# Patient Record
Sex: Male | Born: 1970 | Hispanic: No | Marital: Single | Smoking: Never smoker
Health system: Southern US, Community
[De-identification: ages and names within clinical notes are randomized; demographics above are authoritative.]

## PROBLEM LIST (undated history)

## (undated) DIAGNOSIS — F419 Anxiety disorder, unspecified: Secondary | ICD-10-CM

## (undated) HISTORY — DX: Anxiety disorder, unspecified: F41.9

---

## 2002-02-26 ENCOUNTER — Emergency Department (HOSPITAL_COMMUNITY): Admission: EM | Admit: 2002-02-26 | Discharge: 2002-02-26 | Payer: Self-pay | Admitting: Emergency Medicine

## 2014-11-28 ENCOUNTER — Encounter (HOSPITAL_COMMUNITY): Payer: Self-pay | Admitting: Emergency Medicine

## 2014-11-28 ENCOUNTER — Emergency Department (HOSPITAL_COMMUNITY)
Admission: EM | Admit: 2014-11-28 | Discharge: 2014-11-29 | Disposition: A | Discharge status: Home / Self Care | Payer: Self-pay | Attending: Emergency Medicine | Admitting: Emergency Medicine

## 2014-11-28 DIAGNOSIS — R739 Hyperglycemia, unspecified: Secondary | ICD-10-CM | POA: Insufficient documentation

## 2014-11-28 DIAGNOSIS — R630 Anorexia: Secondary | ICD-10-CM | POA: Insufficient documentation

## 2014-11-28 DIAGNOSIS — Z79899 Other long term (current) drug therapy: Secondary | ICD-10-CM | POA: Insufficient documentation

## 2014-11-28 DIAGNOSIS — R0982 Postnasal drip: Secondary | ICD-10-CM | POA: Insufficient documentation

## 2014-11-28 DIAGNOSIS — F419 Anxiety disorder, unspecified: Secondary | ICD-10-CM | POA: Insufficient documentation

## 2014-11-28 LAB — CBC WITH DIFFERENTIAL/PLATELET
Basophils Absolute: 0 10*3/uL (ref 0.0–0.1)
Basophils Relative: 0 % (ref 0–1)
Eosinophils Absolute: 0 10*3/uL (ref 0.0–0.7)
Eosinophils Relative: 0 % (ref 0–5)
HCT: 37.7 % (ref 36.0–46.0)
HEMOGLOBIN: 12.9 g/dL (ref 12.0–15.0)
LYMPHS ABS: 2 10*3/uL (ref 0.7–4.0)
LYMPHS PCT: 26 % (ref 12–46)
MCH: 30.1 pg (ref 26.0–34.0)
MCHC: 34.2 g/dL (ref 30.0–36.0)
MCV: 88.1 fL (ref 78.0–100.0)
MONOS PCT: 4 % (ref 3–12)
Monocytes Absolute: 0.3 10*3/uL (ref 0.1–1.0)
NEUTROS ABS: 5.3 10*3/uL (ref 1.7–7.7)
NEUTROS PCT: 70 % (ref 43–77)
Platelets: 346 10*3/uL (ref 150–400)
RBC: 4.28 MIL/uL (ref 3.87–5.11)
RDW: 12.4 % (ref 11.5–15.5)
WBC: 7.7 10*3/uL (ref 4.0–10.5)
nRBC: 0 /100 WBC

## 2014-11-28 LAB — COMPREHENSIVE METABOLIC PANEL
ALK PHOS: 73 U/L (ref 39–117)
ALT: 13 U/L (ref 0–35)
AST: 14 U/L (ref 0–37)
Albumin: 4.1 g/dL (ref 3.5–5.2)
Anion gap: 12 (ref 5–15)
BUN: 11 mg/dL (ref 6–23)
CO2: 26 meq/L (ref 19–32)
Calcium: 9.1 mg/dL (ref 8.4–10.5)
Chloride: 97 mEq/L (ref 96–112)
Creatinine, Ser: 0.82 mg/dL (ref 0.50–1.10)
GFR, EST NON AFRICAN AMERICAN: 86 mL/min — AB (ref >90)
Glucose, Bld: 140 mg/dL — ABNORMAL HIGH (ref 70–99)
POTASSIUM: 3.5 meq/L — AB (ref 3.7–5.3)
SODIUM: 135 meq/L — AB (ref 137–147)
Total Bilirubin: 0.4 mg/dL (ref 0.3–1.2)
Total Protein: 7.7 g/dL (ref 6.0–8.3)

## 2014-11-28 NOTE — ED Notes (Signed)
Pt. reports intermittent anxiety/panic attack onset last week with poor appetite /insomnia and fatigue . Denies hallucinations , no suicidal ideations .

## 2014-11-29 MED ORDER — LORAZEPAM 1 MG PO TABS
1.0000 mg | ORAL_TABLET | Freq: Once | ORAL | Status: AC
  Administered 2014-11-29: 1 mg via ORAL
  Filled 2014-11-29: qty 1

## 2014-11-29 MED ORDER — LORAZEPAM 1 MG PO TABS: 1.0000 mg | ORAL_TABLET | Freq: Three times a day (TID) | ORAL | Status: DC | PRN

## 2014-11-29 MED ORDER — ZOLPIDEM TARTRATE 10 MG PO TABS: 10.0000 mg | ORAL_TABLET | Freq: Every evening | ORAL | Status: DC | PRN

## 2014-11-29 NOTE — Discharge Instructions (Signed)
Your blood sugar was 140 - this is slightly elevated and needs to be monitored. You need to get a primary care provider to follow that to make sure you are not developing diabetes.  Generalized Anxiety Disorder Generalized anxiety disorder (GAD) is a mental disorder. It interferes with life functions, including relationships, work, and school. GAD is different from normal anxiety, which everyone experiences at some point in their lives in response to specific life events and activities. Normal anxiety actually helps us prepare for and get through these life events and activities. Normal anxiety goes away after the event or activity is over.  GAD causes anxiety that is not necessarily related to specific events or activities. It also causes excess anxiety in proportion to specific events or activities. The anxiety associated with GAD is also difficult to control. GAD can vary from mild to severe. People with severe GAD can have intense waves of anxiety with physical symptoms (panic attacks).  SYMPTOMS The anxiety and worry associated with GAD are difficult to control. This anxiety and worry are related to many life events and activities and also occur more days than not for 6 months or longer. People with GAD also have three or more of the following symptoms (one or more in children):  Restlessness.   Fatigue.  Difficulty concentrating.   Irritability.  Muscle tension.  Difficulty sleeping or unsatisfying sleep. DIAGNOSIS GAD is diagnosed through an assessment by your health care provider. Your health care provider will ask you questions aboutyour mood,physical symptoms, and events in your life. Your health care provider may ask you about your medical history and use of alcohol or drugs, including prescription medicines. Your health care provider may also do a physical exam and blood tests. Certain medical conditions and the use of certain substances can cause symptoms similar to those  associated with GAD. Your health care provider may refer you to a mental health specialist for further evaluation. TREATMENT The following therapies are usually used to treat GAD:   Medication. Antidepressant medication usually is prescribed for long-term daily control. Antianxiety medicines may be added in severe cases, especially when panic attacks occur.   Talk therapy (psychotherapy). Certain types of talk therapy can be helpful in treating GAD by providing support, education, and guidance. A form of talk therapy called cognitive behavioral therapy can teach you healthy ways to think about and react to daily life events and activities.  Stress managementtechniques. These include yoga, meditation, and exercise and can be very helpful when they are practiced regularly. A mental health specialist can help determine which treatment is best for you. Some people see improvement with one therapy. However, other people require a combination of therapies. Document Released: 04/12/2013 Document Revised: 05/02/2014 Document Reviewed: 04/12/2013 [facility] Patient Information 2015 [place], [place]. This information is not intended to replace advice given to you by your health care provider. Make sure you discuss any questions you have with your health care provider.  Lorazepam tablets What is this medicine? LORAZEPAM (lor A ze pam) is a benzodiazepine. It is used to treat anxiety. This medicine may be used for other purposes; ask your health care provider or pharmacist if you have questions. COMMON BRAND NAME(S): Ativan What should I tell my health care provider before I take this medicine? They need to know if you have any of these conditions: -alcohol or drug abuse problem -bipolar disorder, depression, psychosis or other mental health condition -glaucoma -kidney or liver disease -lung disease or breathing difficulties -myasthenia gravis -Parkinson's  disease -seizures or a history of  seizures -suicidal thoughts -an unusual or allergic reaction to lorazepam, other benzodiazepines, foods, dyes, or preservatives -pregnant or trying to get pregnant -breast-feeding How should I use this medicine? Take this medicine by mouth with a glass of water. Follow the directions on the prescription label. If it upsets your stomach, take it with food or milk. Take your medicine at regular intervals. Do not take it more often than directed. Do not stop taking except on the advice of your doctor or health care professional. Talk to your pediatrician regarding the use of this medicine in children. Special care may be needed. Overdosage: If you think you have taken too much of this medicine contact a poison control center or emergency room at once. NOTE: This medicine is only for you. Do not share this medicine with others. What if I miss a dose? If you miss a dose, take it as soon as you can. If it is almost time for your next dose, take only that dose. Do not take double or extra doses. What may interact with this medicine? -barbiturate medicines for inducing sleep or treating seizures, like phenobarbital -clozapine -medicines for depression, mental problems or psychiatric disturbances -medicines for sleep -phenytoin -probenecid -theophylline -valproic acid This list may not describe all possible interactions. Give your health care provider a list of all the medicines, herbs, non-prescription drugs, or dietary supplements you use. Also tell them if you smoke, drink alcohol, or use illegal drugs. Some items may interact with your medicine. What should I watch for while using this medicine? Visit your doctor or health care professional for regular checks on your progress. Your body may become dependent on this medicine, ask your doctor or health care professional if you still need to take it. However, if you have been taking this medicine regularly for some time, do not suddenly stop taking  it. You must gradually reduce the dose or you may get severe side effects. Ask your doctor or health care professional for advice before increasing or decreasing the dose. Even after you stop taking this medicine it can still affect your body for several days. You may get drowsy or dizzy. Do not drive, use machinery, or do anything that needs mental alertness until you know how this medicine affects you. To reduce the risk of dizzy and fainting spells, do not stand or sit up quickly, especially if you are an older patient. Alcohol may increase dizziness and drowsiness. Avoid alcoholic drinks. Do not treat yourself for coughs, colds or allergies without asking your doctor or health care professional for advice. Some ingredients can increase possible side effects. What side effects may I notice from receiving this medicine? Side effects that you should report to your doctor or health care professional as soon as possible: -changes in vision -confusion -depression -mood changes, excitability or aggressive behavior -movement difficulty, staggering or jerky movements -muscle cramps -restlessness -weakness or tiredness Side effects that usually do not require medical attention (report to your doctor or health care professional if they continue or are bothersome): -constipation or diarrhea -difficulty sleeping, nightmares -dizziness, drowsiness -headache -nausea, vomiting This list may not describe all possible side effects. Call your doctor for medical advice about side effects. You may report side effects to FDA at 1-800-FDA-1088. Where should I keep my medicine? Keep out of the reach of children. This medicine can be abused. Keep your medicine in a safe place to protect it from theft. Do not share this medicine  with anyone. Selling or giving away this medicine is dangerous and against the law. Store at room temperature between 20 and 25 degrees C (68 and 77 degrees F). Protect from light. Keep  container tightly closed. Throw away any unused medicine after the expiration date. NOTE: This sheet is a summary. It may not cover all possible information. If you have questions about this medicine, talk to your doctor, pharmacist, or health care provider.  2015, Elsevier/Gold Standard. (2008-06-17 14:58:20)  Zolpidem tablets What is this medicine? ZOLPIDEM (zole PI dem) is used to treat insomnia. This medicine helps you to fall asleep and sleep through the night. This medicine may be used for other purposes; ask your health care provider or pharmacist if you have questions. COMMON BRAND NAME(S): Ambien What should I tell my health care provider before I take this medicine? They need to know if you have any of these conditions: -depression -history of a drug or alcohol abuse problem -liver disease -lung or breathing disease -suicidal thoughts -an unusual or allergic reaction to zolpidem, other medicines, foods, dyes, or preservatives -pregnant or trying to get pregnant -breast-feeding How should I use this medicine? Take this medicine by mouth with a glass of water. Follow the directions on the prescription label. It is better to take this medicine on an empty stomach and only when you are ready for bed. Do not take your medicine more often than directed. If you have been taking this medicine for several weeks and suddenly stop taking it, you may get unpleasant withdrawal symptoms. Your doctor or health care professional may want to gradually reduce the dose. Do not stop taking this medicine on your own. Always follow your doctor or health care professional's advice. A special MedGuide will be given to you by the pharmacist with each prescription and refill. Be sure to read this information carefully each time. Talk to your pediatrician regarding the use of this medicine in children. Special care may be needed. Overdosage: If you think you have taken too much of this medicine contact a  poison control center or emergency room at once. NOTE: This medicine is only for you. Do not share this medicine with others. What if I miss a dose? This does not apply. This medicine should only be taken immediately before going to sleep. Do not take double or extra doses. What may interact with this medicine? -herbal medicines like kava kava, melatonin, St. John's wort and valerian -medicines for fungal infections like ketoconazole, fluconazole, or itraconazole -medicines for treating depression or other mental problems -other medicines given for sleep -some medicines for Parkinson' s disease or other movement disorders -some medicines used to treat HIV infection or AIDS, like ritonavir This list may not describe all possible interactions. Give your health care provider a list of all the medicines, herbs, non-prescription drugs, or dietary supplements you use. Also tell them if you smoke, drink alcohol, or use illegal drugs. Some items may interact with your medicine. What should I watch for while using this medicine? Visit your doctor or health care professional for regular checks on your progress. Keep a regular sleep schedule by going to bed at about the same time each night. Avoid caffeine-containing drinks in the evening hours. When sleep medicines are used every night for more than a few weeks, they may stop working. Talk to your doctor if you still have trouble sleeping. Do not take this medicine unless you are able to get a full night's sleep before you must be  active again. You may not be able to remember things that you do in the hours after you take this medicine. Some people have reported driving, making phone calls, or preparing and eating food while asleep after taking sleep medicine. Take this medicine right before going to sleep. Tell your doctor if you are have any problems with your memory. After you stop taking this medicine, you may have trouble falling asleep. This is called  rebound insomnia. This problem usually goes away on its own after 1 or 2 nights. You may get drowsy or dizzy. Do not drive, use machinery, or do anything that needs mental alertness until you know how this medicine affects you. Do not stand or sit up quickly, especially if you are an older patient. This reduces the risk of dizzy or fainting spells. Alcohol may interfere with the effect of this medicine. Avoid alcoholic drinks. This medicine may cause a decrease in mental alertness the morning after use, even if you feel that you are fully awake. Tell your doctor if you will need to perform activities requiring full alertness, such as driving, the next morning after you have taken this medicine. If you or your family notice any changes in your behavior, or if you have any unusual or disturbing thoughts, call your doctor right away. What side effects may I notice from receiving this medicine? Side effects that you should report to your doctor or health care professional as soon as possible: -allergic reactions like skin rash, itching or hives, swelling of the face, lips, or tongue -changes in vision -confusion -depressed mood -feeling faint or lightheaded, falls -hallucinations -problems with balance, speaking, walking -restlessness, excitability, or feelings of agitation -unusual activities while asleep like driving, eating, making phone calls Side effects that usually do not require medical attention (report to your doctor or health care professional if they continue or are bothersome): -diarrhea -dizziness, or daytime drowsiness, sometimes called a hangover effect -headache This list may not describe all possible side effects. Call your doctor for medical advice about side effects. You may report side effects to FDA at 1-800-FDA-1088. Where should I keep my medicine? Keep out of the reach of children. This medicine can be abused. Keep your medicine in a safe place to protect it from theft. Do  not share this medicine with anyone. Selling or giving away this medicine is dangerous and against the law. Store at room temperature between 20 and 25 degrees C (68 and 77 degrees F). Throw away any unused medicine after the expiration date. NOTE: This sheet is a summary. It may not cover all possible information. If you have questions about this medicine, talk to your doctor, pharmacist, or health care provider.  2015, Elsevier/Gold Standard. (2012-12-01 16:54:48)
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
## 2016-01-09 ENCOUNTER — Ambulatory Visit: Payer: Self-pay | Attending: Internal Medicine

## 2016-02-06 ENCOUNTER — Encounter: Payer: Self-pay | Admitting: Family Medicine

## 2016-02-06 ENCOUNTER — Ambulatory Visit (INDEPENDENT_AMBULATORY_CARE_PROVIDER_SITE_OTHER): Payer: Self-pay | Admitting: Family Medicine

## 2016-02-06 VITALS — BP 115/74 | HR 66 | Temp 98.2°F | Resp 16 | Ht 63.0 in | Wt 145.0 lb

## 2016-02-06 DIAGNOSIS — Z23 Encounter for immunization: Secondary | ICD-10-CM

## 2016-02-06 DIAGNOSIS — Z0000 Encounter for general adult medical examination without abnormal findings: Secondary | ICD-10-CM

## 2016-02-06 LAB — CBC WITH DIFFERENTIAL/PLATELET
BASOS ABS: 0.1 10*3/uL (ref 0.0–0.1)
BASOS PCT: 1 % (ref 0–1)
EOS ABS: 0.1 10*3/uL (ref 0.0–0.7)
Eosinophils Relative: 1 % (ref 0–5)
HCT: 42.5 % (ref 39.0–52.0)
Hemoglobin: 14 g/dL (ref 13.0–17.0)
Lymphocytes Relative: 41 % (ref 12–46)
Lymphs Abs: 2.7 10*3/uL (ref 0.7–4.0)
MCH: 30.6 pg (ref 26.0–34.0)
MCHC: 32.9 g/dL (ref 30.0–36.0)
MCV: 93 fL (ref 78.0–100.0)
MPV: 10.7 fL (ref 8.6–12.4)
Monocytes Absolute: 0.3 10*3/uL (ref 0.1–1.0)
Monocytes Relative: 4 % (ref 3–12)
NEUTROS PCT: 53 % (ref 43–77)
Neutro Abs: 3.6 10*3/uL (ref 1.7–7.7)
Platelets: 333 10*3/uL (ref 150–400)
RBC: 4.57 MIL/uL (ref 4.22–5.81)
RDW: 13.8 % (ref 11.5–15.5)
WBC: 6.7 10*3/uL (ref 4.0–10.5)

## 2016-02-06 LAB — POCT URINALYSIS DIP (DEVICE)
Bilirubin Urine: NEGATIVE
Glucose, UA: NEGATIVE mg/dL
Hgb urine dipstick: NEGATIVE
KETONES UR: NEGATIVE mg/dL
Leukocytes, UA: NEGATIVE
NITRITE: NEGATIVE
PH: 7.5 (ref 5.0–8.0)
PROTEIN: NEGATIVE mg/dL
Specific Gravity, Urine: 1.02 (ref 1.005–1.030)
UROBILINOGEN UA: 0.2 mg/dL (ref 0.0–1.0)

## 2016-02-06 NOTE — Progress Notes (Signed)
Subjective:    Patient ID: Vincent Larson, male    DOB: 1971/11/21, 45 y.o.   MRN: [id number]  HPI Mr. Arvil Utz, a 45 year old male presents to establish care. He states that he has never had a primary provider and has primarily been using the emergency department for primary care. He states that he  He states that they have not been following a routine exercise regimen and does not eat a balanced diet. Patient is currently not up to date with vaccinations, prostate examination, and does not warrant a colonoscopy. Last complete physical was greater than 1 year ago.   History reviewed. No pertinent past medical history.  Immunization History  Administered Date(s) Administered  . Influenza,inj,Quad PF,36+ Mos 02/06/2016  . Tdap 02/06/2016    No Known Allergies  Social History   Social History  . Marital Status: Married    Spouse Name: N/A  . Number of Children: N/A  . Years of Education: N/A   Occupational History  . Not on file.   Social History Main Topics  . Smoking status: Never Smoker   . Smokeless tobacco: Not on file  . Alcohol Use: No  . Drug Use: No  . Sexual Activity: Not on file   Other Topics Concern  . Not on file   Social History Narrative  History reviewed. No pertinent past surgical history.  Review of Systems  Constitutional: Negative.  Negative for fatigue.  HENT: Negative.   Eyes: Negative.  Negative for photophobia.  Respiratory: Negative.   Cardiovascular: Negative.   Gastrointestinal: Negative.   Endocrine: Negative.  Negative for polydipsia, polyphagia and polyuria.  Genitourinary: Negative.   Musculoskeletal: Negative.   Skin: Negative.   Allergic/Immunologic: Negative for immunocompromised state.  Neurological: Negative.   Hematological: Negative.   Psychiatric/Behavioral: The patient is nervous/anxious.        During times of anxiety      Objective:   Physical Exam  Constitutional: He is oriented to person, place, and time. He  appears well-developed and well-nourished.  HENT:  Head: Normocephalic and atraumatic.  Right Ear: External ear normal.  Left Ear: External ear normal.  Eyes: Conjunctivae and EOM are normal. Pupils are equal, round, and reactive to light.  Neck: Normal range of motion. Neck supple.  Cardiovascular: Normal rate, regular rhythm, normal heart sounds and intact distal pulses.   Pulmonary/Chest: Effort normal and breath sounds normal.  Abdominal: Soft. Bowel sounds are normal.  Musculoskeletal: Normal range of motion.  Neurological: He is alert and oriented to person, place, and time. He has normal reflexes.  Skin: Skin is warm and dry.  Psychiatric: He has a normal mood and affect. His behavior is normal. Judgment and thought content normal.     BP 115/74 mmHg  Pulse 66  Temp(Src) 98.2 F (36.8 C) (Oral)  Resp 16  Ht  (1.6 m)  Wt 145 lb (65.772 kg)  BMI 25.69 kg/m2 Assessment & Plan:  1. Annual physical exam Recommend a lowfat, low carbohydrate diet divided over 5-6 small meals, increase water intake to 6-8 glasses, and 150 minutes per week of cardiovascular exercise.  - CBC with Differential - IFOBT POC (occult bld, rslt in office) - COMPLETE METABOLIC PANEL WITH GFR - Hemoglobin A1c - Lipid Panel - TSH - POCT urinalysis dipstick  2. Need for Tdap vaccination - Tdap vaccine greater than or equal to 7yo IM  3. Need for immunization against influenza  - Flu Vaccine QUAD 36+ mos IM (Fluarix)  RTC: 1 year or as needed Massie Maroon, FNP

## 2016-02-06 NOTE — Patient Instructions (Addendum)
Will schedule follow-up after reviewing laboratory values.  Please follow-up at [facility] walk-in clinic M-F 8am-3pm for anxiety.  [place], [place] Immunization Schedule, Adult  Influenza vaccine.  All adults should be immunized every year.  All adults, including pregnant women and people with hives-only allergy to eggs can receive the inactivated influenza (IIV) vaccine.  Adults aged 46-49 years can receive the recombinant influenza (RIV) vaccine. The RIV vaccine does not contain any egg protein.  Adults aged 64 years or older can receive the standard-dose IIV or the high-dose IIV.  Tetanus, diphtheria, and acellular pertussis (Td, Tdap) vaccine.  Pregnant women should receive 1 dose of Tdap vaccine during each pregnancy. The dose should be obtained regardless of the length of time since the last dose. Immunization is preferred during the 27th to 36th week of gestation.  An adult who has not previously received Tdap or who does not know his or her vaccine status should receive 1 dose of Tdap. This initial dose should be followed by tetanus and diphtheria toxoids (Td) booster doses every 10 years.  Adults with an unknown or incomplete history of completing a 3-dose immunization series with Td-containing vaccines should begin or complete a primary immunization series including a Tdap dose.  Adults should receive a Td booster every 10 years.  Varicella vaccine.  An adult without evidence of immunity to varicella should receive 2 doses or a second dose if he or she has previously received 1 dose.  Pregnant females who do not have evidence of immunity should receive the first dose after pregnancy. This first dose should be obtained before leaving the health care facility. The second dose should be obtained 4-8 weeks after the first dose.  Human papillomavirus (HPV) vaccine.  Females aged 13-26 years who have not received the vaccine previously  should obtain the 3-dose series.  The vaccine is not recommended for use in pregnant females. However, pregnancy testing is not needed before receiving a dose. If a male is found to be pregnant after receiving a dose, no treatment is needed. In that case, the remaining doses should be delayed until after the pregnancy.  Males aged 74-21 years who have not received the vaccine previously should receive the 3-dose series. Males aged 22-26 years may be immunized.  Immunization is recommended through the age of 41 years for any male who has sex with males and did not get any or all doses earlier.  Immunization is recommended for any person with an immunocompromised condition through the age of 64 years if he or she did not get any or all doses earlier.  During the 3-dose series, the second dose should be obtained 4-8 weeks after the first dose. The third dose should be obtained 24 weeks after the first dose and 16 weeks after the second dose.  Zoster vaccine.  One dose is recommended for adults aged 29 years or older unless certain conditions are present.  Measles, mumps, and rubella (MMR) vaccine.  Adults born before [birth year] generally are considered immune to measles and mumps.  Adults born in [birth year] or later should have 1 or more doses of MMR vaccine unless there is a contraindication to the vaccine or there is laboratory evidence of immunity to each of the three diseases.  A routine second dose of MMR vaccine should be obtained at least 28 days after the first dose for students attending postsecondary schools, health care workers, or international travelers.  People who received inactivated measles vaccine  or an unknown type of measles vaccine during 1963-1967 should receive 2 doses of MMR vaccine.  People who received inactivated mumps vaccine or an unknown type of mumps vaccine before 1979 and are at high risk for mumps infection should consider immunization with 2 doses of MMR  vaccine.  For females of childbearing age, rubella immunity should be determined. If there is no evidence of immunity, females who are not pregnant should be vaccinated. If there is no evidence of immunity, females who are pregnant should delay immunization until after pregnancy.  Unvaccinated health care workers born before [birth year] who lack laboratory evidence of measles, mumps, or rubella immunity or laboratory confirmation of disease should consider measles and mumps immunization with 2 doses of MMR vaccine or rubella immunization with 1 dose of MMR vaccine.  Pneumococcal 13-valent conjugate (PCV13) vaccine.  When indicated, a person who is uncertain of his or her immunization history and has no record of immunization should receive the PCV13 vaccine.  An adult aged 82 years or older who has certain medical conditions and has not been previously immunized should receive 1 dose of PCV13 vaccine. This PCV13 should be followed with a dose of pneumococcal polysaccharide (PPSV23) vaccine. The PPSV23 vaccine dose should be obtained at least 8 weeks after the dose of PCV13 vaccine.  An adult aged 43 years or older who has certain medical conditions and previously received 1 or more doses of PPSV23 vaccine should receive 1 dose of PCV13. The PCV13 vaccine dose should be obtained 1 or more years after the last PPSV23 vaccine dose.  Pneumococcal polysaccharide (PPSV23) vaccine.  When PCV13 is also indicated, PCV13 should be obtained first.  All adults aged 32 years and older should be immunized.  An adult younger than age 89 years who has certain medical conditions should be immunized.  Any person who resides in a nursing home or long-term care facility should be immunized.  An adult smoker should be immunized.  People with an immunocompromised condition and certain other conditions should receive both PCV13 and PPSV23 vaccines.  People with human immunodeficiency virus (HIV) infection should be  immunized as soon as possible after diagnosis.  Immunization during chemotherapy or radiation therapy should be avoided.  Routine use of PPSV23 vaccine is not recommended for American Indians, [place]atives, or people younger than 65 years unless there are medical conditions that require PPSV23 vaccine.  When indicated, people who have unknown immunization and have no record of immunization should receive PPSV23 vaccine.  One-time revaccination 5 years after the first dose of PPSV23 is recommended for people aged 19-64 years who have chronic kidney failure, nephrotic syndrome, asplenia, or immunocompromised conditions.  People who received 1-2 doses of PPSV23 before age 93 years should receive another dose of PPSV23 vaccine at age 29 years or later if at least 5 years have passed since the previous dose.  Doses of PPSV23 are not needed for people immunized with PPSV23 at or after age 32 years.  Meningococcal vaccine.  Adults with asplenia or persistent complement component deficiencies should receive 2 doses of quadrivalent meningococcal conjugate (MenACWY-D) vaccine. The doses should be obtained at least 2 months apart.  Microbiologists working with certain meningococcal bacteria, [place] recruits, people at risk during an outbreak, and people who travel to or live in countries with a high rate of meningitis should be immunized.  A first-year college student up through age 92 years who is living in a residence hall should receive a dose if he or  she did not receive a dose on or after his or her 16th birthday.  Adults who have certain high-risk conditions should receive one or more doses of vaccine.  Hepatitis A vaccine.  Adults who wish to be protected from this disease, have certain high-risk conditions, work with hepatitis A-infected animals, work in hepatitis A research labs, or travel to or work in countries with a high rate of hepatitis A should be immunized.  Adults who were  previously unvaccinated and who anticipate close contact with an international adoptee during the first 60 days after arrival in the [place] from a country with a high rate of hepatitis A should be immunized.  Hepatitis B vaccine.  Adults who wish to be protected from this disease, have certain high-risk conditions, may be exposed to blood or other infectious body fluids, are household contacts or sex partners of hepatitis B positive people, are clients or workers in certain care facilities, or travel to or work in countries with a high rate of hepatitis B should be immunized.  Haemophilus influenzae type b (Hib) vaccine.  A previously unvaccinated person with asplenia or sickle cell disease or having a scheduled splenectomy should receive 1 dose of Hib vaccine.  Regardless of previous immunization, a recipient of a hematopoietic stem cell transplant should receive a 3-dose series 6-12 months after his or her successful transplant.  Hib vaccine is not recommended for adults with HIV infection.   This information is not intended to replace advice given to you by your health care provider. Make sure you discuss any questions you have with your health care provider.   Document Released: 03/07/2004 Document Revised: 04/12/2013 Document Reviewed: 02/02/2013 Elsevier Interactive Patient Education 2016 [place] for Adults, Male A healthy lifestyle and preventive care can promote health and wellness. Preventive health guidelines for men include the following key practices:  A routine yearly physical is a good way to check with your health care provider about your health and preventative screening. It is a chance to share any concerns and updates on your health and to receive a thorough exam.  Visit your dentist for a routine exam and preventative care every 6 months. Brush your teeth twice a day and floss once a day. Good oral hygiene prevents tooth decay and gum  disease.  The frequency of eye exams is based on your age, health, family medical history, use of contact lenses, and other factors. Follow your health care provider's recommendations for frequency of eye exams.  Eat a healthy diet. Foods such as vegetables, fruits, whole grains, low-fat dairy products, and lean protein foods contain the nutrients you need without too many calories. Decrease your intake of foods high in solid fats, added sugars, and salt. Eat the right amount of calories for you.Get information about a proper diet from your health care provider, if necessary.  Regular physical exercise is one of the most important things you can do for your health. Most adults should get at least 150 minutes of moderate-intensity exercise (any activity that increases your heart rate and causes you to sweat) each week. In addition, most adults need muscle-strengthening exercises on 2 or more days a week.  Maintain a healthy weight. The body mass index (BMI) is a screening tool to identify possible weight problems. It provides an estimate of body fat based on height and weight. Your health care provider can find your BMI and can help you achieve or maintain a healthy weight.For adults 20 years and  older:  A BMI below 18.5 is considered underweight.  A BMI of 18.5 to 24.9 is normal.  A BMI of 25 to 29.9 is considered overweight.  A BMI of 30 and above is considered obese.  Maintain normal blood lipids and cholesterol levels by exercising and minimizing your intake of saturated fat. Eat a balanced diet with plenty of fruit and vegetables. Blood tests for lipids and cholesterol should begin at age 29 and be repeated every 5 years. If your lipid or cholesterol levels are high, you are over 50, or you are at high risk for heart disease, you may need your cholesterol levels checked more frequently.Ongoing high lipid and cholesterol levels should be treated with medicines if diet and exercise are not  working.  If you smoke, find out from your health care provider how to quit. If you do not use tobacco, do not start.  Lung cancer screening is recommended for adults aged 63-80 years who are at high risk for developing lung cancer because of a history of smoking. A yearly low-dose CT scan of the lungs is recommended for people who have at least a 30-pack-year history of smoking and are a current smoker or have quit within the past 15 years. A pack year of smoking is smoking an average of 1 pack of cigarettes a day for 1 year (for example: 1 pack a day for 30 years or 2 packs a day for 15 years). Yearly screening should continue until the smoker has stopped smoking for at least 15 years. Yearly screening should be stopped for people who develop a health problem that would prevent them from having lung cancer treatment.  If you choose to drink alcohol, do not have more than 2 drinks per day. One drink is considered to be 12 ounces (355 mL) of beer, 5 ounces (148 mL) of wine, or 1.5 ounces (44 mL) of liquor.  Avoid use of street drugs. Do not share needles with anyone. Ask for help if you need support or instructions about stopping the use of drugs.  High blood pressure causes heart disease and increases the risk of stroke. Your blood pressure should be checked at least every 1-2 years. Ongoing high blood pressure should be treated with medicines, if weight loss and exercise are not effective.  If you are 80-52 years old, ask your health care provider if you should take aspirin to prevent heart disease.  Diabetes screening is done by taking a blood sample to check your blood glucose level after you have not eaten for a certain period of time (fasting). If you are not overweight and you do not have risk factors for diabetes, you should be screened once every 3 years starting at age 59. If you are overweight or obese and you are 20-45 years of age, you should be screened for diabetes every year as part of  your cardiovascular risk assessment.  Colorectal cancer can be detected and often prevented. Most routine colorectal cancer screening begins at the age of 62 and continues through age 43. However, your health care provider may recommend screening at an earlier age if you have risk factors for colon cancer. On a yearly basis, your health care provider may provide home test kits to check for hidden blood in the stool. Use of a small camera at the end of a tube to directly examine the colon (sigmoidoscopy or colonoscopy) can detect the earliest forms of colorectal cancer. Talk to your health care provider about this at  age 74, when routine screening begins. Direct exam of the colon should be repeated every 5-10 years through age 79, unless early forms of precancerous polyps or small growths are found.  People who are at an increased risk for hepatitis B should be screened for this virus. You are considered at high risk for hepatitis B if:  You were born in a country where hepatitis B occurs often. Talk with your health care provider about which countries are considered high risk.  Your parents were born in a high-risk country and you have not received a shot to protect against hepatitis B (hepatitis B vaccine).  You have HIV or AIDS.  You use needles to inject street drugs.  You live with, or have sex with, someone who has hepatitis B.  You are a man who has sex with other men (MSM).  You get hemodialysis treatment.  You take certain medicines for conditions such as cancer, organ transplantation, and autoimmune conditions.  Hepatitis C blood testing is recommended for all people born from [birth year] through 1965 and any individual with known risks for hepatitis C.  Practice safe sex. Use condoms and avoid high-risk sexual practices to reduce the spread of sexually transmitted infections (STIs). STIs include gonorrhea, chlamydia, syphilis, trichomonas, herpes, HPV, and human immunodeficiency virus  (HIV). Herpes, HIV, and HPV are viral illnesses that have no cure. They can result in disability, cancer, and death.  If you are a man who has sex with other men, you should be screened at least once per year for:  HIV.  Urethral, rectal, and pharyngeal infection of gonorrhea, chlamydia, or both.  If you are at risk of being infected with HIV, it is recommended that you take a prescription medicine daily to prevent HIV infection. This is called preexposure prophylaxis (PrEP). You are considered at risk if:  You are a man who has sex with other men (MSM) and have other risk factors.  You are a heterosexual man, are sexually active, and are at increased risk for HIV infection.  You take drugs by injection.  You are sexually active with a partner who has HIV.  Talk with your health care provider about whether you are at high risk of being infected with HIV. If you choose to begin PrEP, you should first be tested for HIV. You should then be tested every 3 months for as long as you are taking PrEP.  A one-time screening for abdominal aortic aneurysm (AAA) and surgical repair of large AAAs by ultrasound are recommended for men ages 60 to 72 years who are current or former smokers.  Healthy men should no longer receive prostate-specific antigen (PSA) blood tests as part of routine cancer screening. Talk with your health care provider about prostate cancer screening.  Testicular cancer screening is not recommended for adult males who have no symptoms. Screening includes self-exam, a health care provider exam, and other screening tests. Consult with your health care provider about any symptoms you have or any concerns you have about testicular cancer.  Use sunscreen. Apply sunscreen liberally and repeatedly throughout the day. You should seek shade when your shadow is shorter than you. Protect yourself by wearing long sleeves, pants, a wide-brimmed hat, and sunglasses year round, whenever you are  outdoors.  Once a month, do a whole-body skin exam, using a mirror to look at the skin on your back. Tell your health care provider about new moles, moles that have irregular borders, moles that are larger than a pencil  eraser, or moles that have changed in shape or color.  Stay current with required vaccines (immunizations).  Influenza vaccine. All adults should be immunized every year.  Tetanus, diphtheria, and acellular pertussis (Td, Tdap) vaccine. An adult who has not previously received Tdap or who does not know his vaccine status should receive 1 dose of Tdap. This initial dose should be followed by tetanus and diphtheria toxoids (Td) booster doses every 10 years. Adults with an unknown or incomplete history of completing a 3-dose immunization series with Td-containing vaccines should begin or complete a primary immunization series including a Tdap dose. Adults should receive a Td booster every 10 years.  Varicella vaccine. An adult without evidence of immunity to varicella should receive 2 doses or a second dose if he has previously received 1 dose.  Human papillomavirus (HPV) vaccine. Males aged 11-21 years who have not received the vaccine previously should receive the 3-dose series. Males aged 22-26 years may be immunized. Immunization is recommended through the age of 99 years for any male who has sex with males and did not get any or all doses earlier. Immunization is recommended for any person with an immunocompromised condition through the age of 44 years if he did not get any or all doses earlier. During the 3-dose series, the second dose should be obtained 4-8 weeks after the first dose. The third dose should be obtained 24 weeks after the first dose and 16 weeks after the second dose.  Zoster vaccine. One dose is recommended for adults aged 9 years or older unless certain conditions are present.  Measles, mumps, and rubella (MMR) vaccine. Adults born before [birth year] generally are  considered immune to measles and mumps. Adults born in [birth year] or later should have 1 or more doses of MMR vaccine unless there is a contraindication to the vaccine or there is laboratory evidence of immunity to each of the three diseases. A routine second dose of MMR vaccine should be obtained at least 28 days after the first dose for students attending postsecondary schools, health care workers, or international travelers. People who received inactivated measles vaccine or an unknown type of measles vaccine during 1963-1967 should receive 2 doses of MMR vaccine. People who received inactivated mumps vaccine or an unknown type of mumps vaccine before 1979 and are at high risk for mumps infection should consider immunization with 2 doses of MMR vaccine. Unvaccinated health care workers born before [birth year] who lack laboratory evidence of measles, mumps, or rubella immunity or laboratory confirmation of disease should consider measles and mumps immunization with 2 doses of MMR vaccine or rubella immunization with 1 dose of MMR vaccine.  Pneumococcal 13-valent conjugate (PCV13) vaccine. When indicated, a person who is uncertain of his immunization history and has no record of immunization should receive the PCV13 vaccine. All adults 28 years of age and older should receive this vaccine. An adult aged 33 years or older who has certain medical conditions and has not been previously immunized should receive 1 dose of PCV13 vaccine. This PCV13 should be followed with a dose of pneumococcal polysaccharide (PPSV23) vaccine. Adults who are at high risk for pneumococcal disease should obtain the PPSV23 vaccine at least 8 weeks after the dose of PCV13 vaccine. Adults older than 46 years of age who have normal immune system function should obtain the PPSV23 vaccine dose at least 1 year after the dose of PCV13 vaccine.  Pneumococcal polysaccharide (PPSV23) vaccine. When PCV13 is also indicated, PCV13 should be obtained first.  All  adults aged 41 years and older should be immunized. An adult younger than age 48 years who has certain medical conditions should be immunized. Any person who resides in a nursing home or long-term care facility should be immunized. An adult smoker should be immunized. People with an immunocompromised condition and certain other conditions should receive both PCV13 and PPSV23 vaccines. People with human immunodeficiency virus (HIV) infection should be immunized as soon as possible after diagnosis. Immunization during chemotherapy or radiation therapy should be avoided. Routine use of PPSV23 vaccine is not recommended for American Indians, [place] Natives, or people younger than 65 years unless there are medical conditions that require PPSV23 vaccine. When indicated, people who have unknown immunization and have no record of immunization should receive PPSV23 vaccine. One-time revaccination 5 years after the first dose of PPSV23 is recommended for people aged 19-64 years who have chronic kidney failure, nephrotic syndrome, asplenia, or immunocompromised conditions. People who received 1-2 doses of PPSV23 before age 74 years should receive another dose of PPSV23 vaccine at age 23 years or later if at least 5 years have passed since the previous dose. Doses of PPSV23 are not needed for people immunized with PPSV23 at or after age 32 years.  Meningococcal vaccine. Adults with asplenia or persistent complement component deficiencies should receive 2 doses of quadrivalent meningococcal conjugate (MenACWY-D) vaccine. The doses should be obtained at least 2 months apart. Microbiologists working with certain meningococcal bacteria, [place] recruits, people at risk during an outbreak, and people who travel to or live in countries with a high rate of meningitis should be immunized. A first-year college student up through age 26 years who is living in a residence hall should receive a dose if he did not receive a dose on or  after his 16th birthday. Adults who have certain high-risk conditions should receive one or more doses of vaccine.  Hepatitis A vaccine. Adults who wish to be protected from this disease, have chronic liver disease, work with hepatitis A-infected animals, work in hepatitis A research labs, or travel to or work in countries with a high rate of hepatitis A should be immunized. Adults who were previously unvaccinated and who anticipate close contact with an international adoptee during the first 60 days after arrival in the [place] from a country with a high rate of hepatitis A should be immunized.  Hepatitis B vaccine. Adults should be immunized if they wish to be protected from this disease, are under age 41 years and have diabetes, have chronic liver disease, have had more than one sex partner in the past 6 months, may be exposed to blood or other infectious body fluids, are household contacts or sex partners of hepatitis B positive people, are clients or workers in certain care facilities, or travel to or work in countries with a high rate of hepatitis B.  Haemophilus influenzae type b (Hib) vaccine. A previously unvaccinated person with asplenia or sickle cell disease or having a scheduled splenectomy should receive 1 dose of Hib vaccine. Regardless of previous immunization, a recipient of a hematopoietic stem cell transplant should receive a 3-dose series 6-12 months after his successful transplant. Hib vaccine is not recommended for adults with HIV infection. Preventive Service / Frequency Ages 70 to 37  Blood pressure check.** / Every 3-5 years.  Lipid and cholesterol check.** / Every 5 years beginning at age 37.  Hepatitis C blood test.** / For any individual with known risks for hepatitis C.  Skin self-exam. /  Monthly.  Influenza vaccine. / Every year.  Tetanus, diphtheria, and acellular pertussis (Tdap, Td) vaccine.** / Consult your health care provider. 1 dose of Td every 10  years.  Varicella vaccine.** / Consult your health care provider.  HPV vaccine. / 3 doses over 6 months, if 36 or younger.  Measles, mumps, rubella (MMR) vaccine.** / You need at least 1 dose of MMR if you were born in 1957 or later. You may also need a second dose.  Pneumococcal 13-valent conjugate (PCV13) vaccine.** / Consult your health care provider.  Pneumococcal polysaccharide (PPSV23) vaccine.** / 1 to 2 doses if you smoke cigarettes or if you have certain conditions.  Meningococcal vaccine.** / 1 dose if you are age 36 to 63 years and a Market researcher living in a residence hall, or have one of several medical conditions. You may also need additional booster doses.  Hepatitis A vaccine.** / Consult your health care provider.  Hepatitis B vaccine.** / Consult your health care provider.  Haemophilus influenzae type b (Hib) vaccine.** / Consult your health care provider. Ages 41 to 53  Blood pressure check.** / Every year.  Lipid and cholesterol check.** / Every 5 years beginning at age 79.  Lung cancer screening. / Every year if you are aged 82-80 years and have a 30-pack-year history of smoking and currently smoke or have quit within the past 15 years. Yearly screening is stopped once you have quit smoking for at least 15 years or develop a health problem that would prevent you from having lung cancer treatment.  Fecal occult blood test (FOBT) of stool. / Every year beginning at age 40 and continuing until age 2. You may not have to do this test if you get a colonoscopy every 10 years.  Flexible sigmoidoscopy** or colonoscopy.** / Every 5 years for a flexible sigmoidoscopy or every 10 years for a colonoscopy beginning at age 45 and continuing until age 78.  Hepatitis C blood test.** / For all people born from [birth year] through 1965 and any individual with known risks for hepatitis C.  Skin self-exam. / Monthly.  Influenza vaccine. / Every year.  Tetanus, diphtheria,  and acellular pertussis (Tdap/Td) vaccine.** / Consult your health care provider. 1 dose of Td every 10 years.  Varicella vaccine.** / Consult your health care provider.  Zoster vaccine.** / 1 dose for adults aged 40 years or older.  Measles, mumps, rubella (MMR) vaccine.** / You need at least 1 dose of MMR if you were born in 1957 or later. You may also need a second dose.  Pneumococcal 13-valent conjugate (PCV13) vaccine.** / Consult your health care provider.  Pneumococcal polysaccharide (PPSV23) vaccine.** / 1 to 2 doses if you smoke cigarettes or if you have certain conditions.  Meningococcal vaccine.** / Consult your health care provider.  Hepatitis A vaccine.** / Consult your health care provider.  Hepatitis B vaccine.** / Consult your health care provider.  Haemophilus influenzae type b (Hib) vaccine.** / Consult your health care provider. Ages 73 and over  Blood pressure check.** / Every year.  Lipid and cholesterol check.**/ Every 5 years beginning at age 32.  Lung cancer screening. / Every year if you are aged 72-80 years and have a 30-pack-year history of smoking and currently smoke or have quit within the past 15 years. Yearly screening is stopped once you have quit smoking for at least 15 years or develop a health problem that would prevent you from having lung cancer treatment.  Fecal occult  blood test (FOBT) of stool. / Every year beginning at age 28 and continuing until age 87. You may not have to do this test if you get a colonoscopy every 10 years.  Flexible sigmoidoscopy** or colonoscopy.** / Every 5 years for a flexible sigmoidoscopy or every 10 years for a colonoscopy beginning at age 12 and continuing until age 79.  Hepatitis C blood test.** / For all people born from [birth year] through 1966 and any individual with known risks for hepatitis C.  Abdominal aortic aneurysm (AAA) screening.** / A one-time screening for ages 74 to 37 years who are current or former  smokers.  Skin self-exam. / Monthly.  Influenza vaccine. / Every year.  Tetanus, diphtheria, and acellular pertussis (Tdap/Td) vaccine.** / 1 dose of Td every 10 years.  Varicella vaccine.** / Consult your health care provider.  Zoster vaccine.** / 1 dose for adults aged 71 years or older.  Pneumococcal 13-valent conjugate (PCV13) vaccine.** / 1 dose for all adults aged 52 years and older.  Pneumococcal polysaccharide (PPSV23) vaccine.** / 1 dose for all adults aged 27 years and older.  Meningococcal vaccine.** / Consult your health care provider.  Hepatitis A vaccine.** / Consult your health care provider.  Hepatitis B vaccine.** / Consult your health care provider.  Haemophilus influenzae type b (Hib) vaccine.** / Consult your health care provider. **Family history and personal history of risk and conditions may change your health care provider's recommendations.   This information is not intended to replace advice given to you by your health care provider. Make sure you discuss any questions you have with your health care provider.   Document Released: 02/11/2002 Document Revised: 01/06/2015 Document Reviewed: 05/13/2011 Elsevier Interactive Patient Education [employer].

## 2016-02-07 ENCOUNTER — Other Ambulatory Visit: Payer: Self-pay | Admitting: Family Medicine

## 2016-02-07 ENCOUNTER — Encounter: Payer: Self-pay | Admitting: Family Medicine

## 2016-02-07 DIAGNOSIS — E785 Hyperlipidemia, unspecified: Secondary | ICD-10-CM

## 2016-02-07 DIAGNOSIS — R7303 Prediabetes: Secondary | ICD-10-CM

## 2016-02-07 LAB — COMPLETE METABOLIC PANEL WITH GFR
ALT: 25 U/L (ref 9–46)
AST: 17 U/L (ref 10–40)
Albumin: 4.2 g/dL (ref 3.6–5.1)
Alkaline Phosphatase: 64 U/L (ref 40–115)
BILIRUBIN TOTAL: 0.6 mg/dL (ref 0.2–1.2)
BUN: 15 mg/dL (ref 7–25)
CO2: 28 mmol/L (ref 20–31)
Calcium: 8.8 mg/dL (ref 8.6–10.3)
Chloride: 105 mmol/L (ref 98–110)
Creat: 0.77 mg/dL (ref 0.60–1.35)
GFR, Est African American: >89 mL/min (ref >=60)
GLUCOSE: 92 mg/dL (ref 65–99)
Potassium: 3.8 mmol/L (ref 3.5–5.3)
SODIUM: 141 mmol/L (ref 135–146)
TOTAL PROTEIN: 6.9 g/dL (ref 6.1–8.1)

## 2016-02-07 LAB — LIPID PANEL
CHOL/HDL RATIO: 4.3 ratio (ref <=5.0)
CHOLESTEROL: 228 mg/dL — AB (ref 125–200)
HDL: 53 mg/dL (ref >=40)
LDL Cholesterol: 152 mg/dL — ABNORMAL HIGH (ref <130)
Triglycerides: 113 mg/dL (ref <150)
VLDL: 23 mg/dL (ref <30)

## 2016-02-07 LAB — TSH: TSH: 1.11 mIU/L (ref 0.40–4.50)

## 2016-02-07 LAB — HEMOGLOBIN A1C
Hgb A1c MFr Bld: 5.8 % — ABNORMAL HIGH (ref <5.7)
Mean Plasma Glucose: 120 mg/dL — ABNORMAL HIGH (ref <117)

## 2016-02-07 MED ORDER — ATORVASTATIN CALCIUM 20 MG PO TABS: 20.0000 mg | ORAL_TABLET | Freq: Every day | ORAL | Status: AC

## 2016-02-07 NOTE — Progress Notes (Signed)
Reviewed labs, total cholesterol and LDL elevated. Will start low dose statin therapy. Also, hemoglobin A1C is elevated at 5.8, which is consistent with prediabetes. Recommend a lowfat, low carbohydrate diet divided over 5-6 small meals, increase water intake to 6-8 glasses, and 150 minutes per week of cardiovascular exercise.    Meds ordered this encounter  Medications  . atorvastatin (LIPITOR) 20 MG tablet    Sig: Take 1 tablet (20 mg total) by mouth daily.    Dispense:  90 tablet    Refill:  1    Darleene Cumpian M, FNP

## 2016-02-07 NOTE — Progress Notes (Signed)
I have tried to contact patient on all numbers in chart, none which are working. I will send letter regarding labs, recommendations, and instructions. Thanks!

## 2016-07-11 ENCOUNTER — Ambulatory Visit: Payer: Self-pay

## 2016-07-29 ENCOUNTER — Ambulatory Visit (INDEPENDENT_AMBULATORY_CARE_PROVIDER_SITE_OTHER): Payer: Self-pay | Admitting: Family Medicine

## 2016-07-29 VITALS — BP 128/80 | HR 70 | Temp 98.5°F | Resp 17 | Ht 64.5 in | Wt 149.0 lb

## 2016-07-29 DIAGNOSIS — L299 Pruritus, unspecified: Secondary | ICD-10-CM

## 2016-07-29 DIAGNOSIS — L500 Allergic urticaria: Secondary | ICD-10-CM

## 2016-07-29 MED ORDER — RANITIDINE HCL 150 MG PO TABS
150.0000 mg | ORAL_TABLET | Freq: Two times a day (BID) | ORAL | Status: DC
  Administered 2016-07-29: 150 mg via ORAL

## 2016-07-29 MED ORDER — CETIRIZINE HCL 10 MG PO TABS
10.0000 mg | ORAL_TABLET | Freq: Every day | ORAL | Status: DC
  Administered 2016-07-29: 10 mg via ORAL

## 2016-07-29 MED ORDER — METHYLPREDNISOLONE SODIUM SUCC 125 MG IJ SOLR
125.0000 mg | Freq: Once | INTRAMUSCULAR | Status: AC
  Administered 2016-07-29: 125 mg via INTRAMUSCULAR

## 2016-07-29 MED ORDER — RANITIDINE HCL 150 MG PO TABS
150.0000 mg | ORAL_TABLET | Freq: Once | ORAL | Status: AC
  Administered 2016-07-29: 150 mg via ORAL

## 2016-07-29 MED ORDER — PREDNISONE 20 MG PO TABS: ORAL_TABLET | ORAL | 0 refills | Status: DC

## 2016-07-29 MED ORDER — CETIRIZINE HCL 10 MG PO TABS
10.0000 mg | ORAL_TABLET | Freq: Once | ORAL | Status: AC
  Administered 2016-07-29: 10 mg via ORAL

## 2016-07-29 NOTE — Addendum Note (Signed)
Addended by: Maurene Capes on: 07/29/2016 12:54 PM   Modules accepted: Orders

## 2016-07-29 NOTE — Patient Instructions (Addendum)
Take over-the-counter ranitidine (Zantac) 150 mg 1 twice daily for 4 or 5 days  Take over-the-counter cetirizine (Zyrtec) 10 mg 1 daily  Take prescription prednisone 3 daily on Tuesday morning, 2 on Wednesday, one on Thursday  You have been given a shot of cortisone here in the office to counteract the allergic reaction.  If you're doing worse or not improving please return   Ronchas  (Hives)  Las ronchas son reas de la piel inflamadas (hinchadas) rojas y que pican. Pueden cambiar de tamao y de ubicacin en el cuerpo. Las Armed forces operational officer y Geneticist, molecular durante algunas horas o das (ronchas agudas) o durante algunas semanas (ronchas crnicas). No pueden transmitirse de [place] persona a Theodoro Clock (no son contagiosas). Pueden empeorar al rascarse, hacer ejercicios y por estrs emocional.  CAUSAS   Reaccin alrgica a alimentos, aditivos o frmacos.  Infecciones, incluso el resfro comn.  Enfermedades, como la vasculitis, el lupus o la enfermedad tiroidea.  Exposicin al sol, al calor o al fro.  La prctica de ejercicios.  El estrs.  El contacto con algunas sustancias qumicas. SNTOMAS   Zonas hinchadas, rojas o blancas, sobre la piel. Las ronchas pueden cambiar de [place], forma, [place] y Armed forces logistics/support/administrative officer.  Picazn.  Hinchazn de las [employer] y [place]. Esto puede ocurrir si las ronchas se desarrollan en capas profundas de la piel. DIAGNSTICO  El mdico puede diagnosticar el problema haciendo un examen fsico. Conley Rolls indicar anlisis de sangre o un estudio de la piel para Production assistant, radio causa. En algunos casos, no puede determinarse la causa.  TRATAMIENTO  Los casos leves generalmente mejoran con medicamentos como los antihistamnicos. Los casos ms graves pueden requerir una inyeccin de epinefrina de Associate Professor. Si se conoce la causa de la urticaria, el tratamiento incluye evitar el factor desencadenante.  INSTRUCCIONES PARA EL CUIDADO EN EL HOGAR    Evite las causas que han desencadenado las ronchas.  Tome los antihistamnicos segn las indicaciones del mdico para reducir la gravedad de las ronchas. Generalmente se recomiendan los [employer] no son sedantes o con bajo efecto sedante. No conduzca vehculos mientras toma antihistamnicos.  Tome los medicamentos para la picazn exactamente como le indic el mdico.  Use ropas sueltas.  Cumpla con todas las visitas de control, segn le indique su mdico. SOLICITE ATENCIN MDICA SI:   Siente una picazn intensa o persistente que no se calma con los medicamentos.  Le duelen las articulaciones o estn inflamadas. SOLICITE ATENCIN MDICA DE INMEDIATO SI:   Tiene fiebre.  Tiene la boca o los labios hinchados.  Tiene problemas para respirar o tragar.  Siente una opresin en la garganta o en el pecho.  Siente dolor abdominal. Estos problemas pueden ser los primeros signos de una reaccin alrgica que ponga en peligro la vida. Llame a los servicios de emergencia locales (911 en los [place]). ASEGRESE DE QUE:   Comprende estas instrucciones.  Controlar su enfermedad.  Solicitar ayuda de inmediato si no mejora o si empeora.   Esta informacin no tiene Theme park manager el consejo del mdico. Asegrese de hacerle al mdico cualquier pregunta que tenga.   Document Released: 12/16/2005 Document Revised: 12/21/2013 Elsevier Interactive Patient Education [employer]     IF you received an x-ray today, you will receive an invoice from [facility]. Please contact [facility] at [phone] with questions or concerns regarding your invoice.   IF you received labwork today, you will receive an invoice from [employer].  Please contact [facility] at [phone] with questions or concerns regarding your invoice.   Our billing staff will not be able to assist you with questions regarding bills from these  companies.  You will be contacted with the lab results as soon as they are available. The fastest way to get your results is to activate your My Chart account. Instructions are located on the last page of this paperwork. If you have not heard from [place] regarding the results in 2 weeks, please contact this office.

## 2016-07-29 NOTE — Progress Notes (Signed)
Patient ID: Vincent Larson, male    DOB: 1971/05/05  Age: 45 y.o. MRN: [id number]  Chief Complaint  Patient presents with  . Rash    allergic reaction     Subjective:   45 year old man with history of having developed itching a little bit on Saturday night then worse on Sunday had today. 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 He ate some food there. He had eaten some shrimp at the [employer] earlier in the week and got a little itching then. He doesn't think he ate any trip this weekend. Has never had hives before.  Current allergies, medications, problem list, past/family and social histories reviewed.  Objective:  BP 128/80 (BP Location: Right Arm, Patient Position: Sitting, Cuff Size: Normal)   Pulse 70   Temp 98.5 F (36.9 C) (Oral)   Resp 17   Ht 5' 4.5" (1.638 m)   Wt 149 lb (67.6 kg)   SpO2 99%   BMI 25.18 kg/m   Urticarial rash on around waistline and on arms itches badly. Throat clear without lesions. Chest clear. Neck supple without nodes.  Assessment & Plan:   Assessment: 1. Allergic urticaria   2. Itching       Plan: Treat for the urticaria  No orders of the defined types were placed in this encounter.   Meds ordered this encounter  Medications  . terbinafine (LAMISIL) 1 % cream    Sig: Apply 1 application topically 2 (two) times daily.  . methylPREDNISolone sodium succinate (SOLU-MEDROL) 125 mg/2 mL injection 125 mg  . ranitidine (ZANTAC) tablet 150 mg  . cetirizine (ZYRTEC) tablet 10 mg  . predniSONE (DELTASONE) 20 MG tablet    Sig: Take 3 pills Tuesday morning, 2 on Wednesday, one on Thursday for allergic reaction    Dispense:  6 tablet    Refill:  0         Patient Instructions   Take over-the-counter ranitidine (Zantac) 150 mg 1 twice daily for 4 or 5 days  Take over-the-counter cetirizine (Zyrtec) 10 mg 1 daily  Take prescription prednisone 3 daily on Tuesday  morning, 2 on Wednesday, one on Thursday  You have been given a shot of cortisone here in the office to counteract the allergic reaction.  If you're doing worse or not improving please return   Ronchas  (Hives)  Las ronchas son reas de la piel inflamadas (hinchadas) rojas y que pican. Pueden cambiar de tamao y de ubicacin en el cuerpo. Las Armed forces operational officer y Geneticist, molecular durante algunas horas o das (ronchas agudas) o durante algunas semanas (ronchas crnicas). No pueden transmitirse de [place] persona a Theodoro Clock (no son contagiosas). Pueden empeorar al rascarse, hacer ejercicios y por estrs emocional.  CAUSAS   Reaccin alrgica a alimentos, aditivos o frmacos.  Infecciones, incluso el resfro comn.  Enfermedades, como la vasculitis, el lupus o la enfermedad tiroidea.  Exposicin al sol, al calor o al fro.  La prctica de ejercicios.  El estrs.  El contacto con algunas sustancias qumicas. SNTOMAS   Zonas hinchadas, rojas o blancas, sobre la piel. Las ronchas pueden cambiar de [place], forma, [place] y Armed forces logistics/support/administrative officer.  Picazn.  Hinchazn de las [employer] y [place]. Esto puede ocurrir si las ronchas se desarrollan en capas profundas de la piel. DIAGNSTICO  El mdico puede diagnosticar el problema haciendo un examen fsico. Conley Rolls indicar anlisis de [place] o un estudio  de la piel para Production assistant, radio causa. En algunos casos, no puede determinarse la causa.  TRATAMIENTO  Los casos leves generalmente mejoran con medicamentos como los antihistamnicos. Los casos ms graves pueden requerir una inyeccin de epinefrina de Associate Professor. Si se conoce la causa de la urticaria, el tratamiento incluye evitar el factor desencadenante.  INSTRUCCIONES PARA EL CUIDADO EN EL HOGAR   Evite las causas que han desencadenado las ronchas.  Tome los antihistamnicos segn las indicaciones del mdico para reducir la gravedad de las ronchas. Generalmente se recomiendan  los [employer] no son sedantes o con bajo efecto sedante. No conduzca vehculos mientras toma antihistamnicos.  Tome los medicamentos para la picazn exactamente como le indic el mdico.  Use ropas sueltas.  Cumpla con todas las visitas de control, segn le indique su mdico. SOLICITE ATENCIN MDICA SI:   Siente una picazn intensa o persistente que no se calma con los medicamentos.  Le duelen las articulaciones o estn inflamadas. SOLICITE ATENCIN MDICA DE INMEDIATO SI:   Tiene fiebre.  Tiene la boca o los labios hinchados.  Tiene problemas para respirar o tragar.  Siente una opresin en la garganta o en el pecho.  Siente dolor abdominal. Estos problemas pueden ser los primeros signos de una reaccin alrgica que ponga en peligro la vida. Llame a los servicios de emergencia locales (911 en los [place]). ASEGRESE DE QUE:   Comprende estas instrucciones.  Controlar su enfermedad.  Solicitar ayuda de inmediato si no mejora o si empeora.   Esta informacin no tiene Theme park manager el consejo del mdico. Asegrese de hacerle al mdico cualquier pregunta que tenga.   Document Released: 12/16/2005 Document Revised: 12/21/2013 Elsevier Interactive Patient Education [employer]     IF you received an x-ray today, you will receive an invoice from [facility]. Please contact [facility] at [phone] with questions or concerns regarding your invoice.   IF you received labwork today, you will receive an invoice from [employer]. Please contact [facility] at [phone] with questions or concerns regarding your invoice.   Our billing staff will not be able to assist you with questions regarding bills from these companies.  You will be contacted with the lab results as soon as they are available. The fastest way to get your results is to activate your My Chart account. Instructions are located on the  last page of this paperwork. If you have not heard from [place] regarding the results in 2 weeks, please contact this office.         No Follow-up on file.   Herb Beltre, MD 07/29/2016

## 2017-10-29 ENCOUNTER — Encounter (HOSPITAL_COMMUNITY): Payer: Self-pay | Admitting: Emergency Medicine

## 2017-10-29 ENCOUNTER — Encounter: Payer: Self-pay | Admitting: Physician Assistant

## 2017-10-29 ENCOUNTER — Ambulatory Visit (INDEPENDENT_AMBULATORY_CARE_PROVIDER_SITE_OTHER): Payer: Self-pay | Admitting: Physician Assistant

## 2017-10-29 VITALS — BP 98/60 | HR 67 | Temp 102.0°F | Resp 16 | Ht 63.5 in | Wt 148.6 lb

## 2017-10-29 DIAGNOSIS — J069 Acute upper respiratory infection, unspecified: Secondary | ICD-10-CM | POA: Insufficient documentation

## 2017-10-29 DIAGNOSIS — Z79899 Other long term (current) drug therapy: Secondary | ICD-10-CM | POA: Insufficient documentation

## 2017-10-29 DIAGNOSIS — R51 Headache: Secondary | ICD-10-CM | POA: Insufficient documentation

## 2017-10-29 DIAGNOSIS — R059 Cough, unspecified: Secondary | ICD-10-CM

## 2017-10-29 DIAGNOSIS — R0981 Nasal congestion: Secondary | ICD-10-CM

## 2017-10-29 DIAGNOSIS — R509 Fever, unspecified: Secondary | ICD-10-CM

## 2017-10-29 DIAGNOSIS — J111 Influenza due to unidentified influenza virus with other respiratory manifestations: Secondary | ICD-10-CM

## 2017-10-29 DIAGNOSIS — R05 Cough: Secondary | ICD-10-CM

## 2017-10-29 LAB — URINALYSIS, ROUTINE W REFLEX MICROSCOPIC
Bilirubin Urine: NEGATIVE
GLUCOSE, UA: NEGATIVE mg/dL
Ketones, ur: NEGATIVE mg/dL
LEUKOCYTES UA: NEGATIVE
NITRITE: NEGATIVE
Protein, ur: 30 mg/dL — AB
Specific Gravity, Urine: 1.021 (ref 1.005–1.030)
pH: 6 (ref 5.0–8.0)

## 2017-10-29 LAB — CBC WITH DIFFERENTIAL/PLATELET
Basophils Absolute: 0 10*3/uL (ref 0.0–0.1)
Basophils Relative: 0 %
EOS ABS: 0 10*3/uL (ref 0.0–0.7)
EOS PCT: 0 %
HCT: 37.1 % — ABNORMAL LOW (ref 39.0–52.0)
Hemoglobin: 13 g/dL (ref 13.0–17.0)
LYMPHS ABS: 1.1 10*3/uL (ref 0.7–4.0)
LYMPHS PCT: 18 %
MCH: 30.8 pg (ref 26.0–34.0)
MCHC: 35 g/dL (ref 30.0–36.0)
MCV: 87.9 fL (ref 78.0–100.0)
MONO ABS: 0.2 10*3/uL (ref 0.1–1.0)
MONOS PCT: 3 %
Neutro Abs: 4.8 10*3/uL (ref 1.7–7.7)
Neutrophils Relative %: 79 %
PLATELETS: 179 10*3/uL (ref 150–400)
RBC: 4.22 MIL/uL (ref 4.22–5.81)
RDW: 12.7 % (ref 11.5–15.5)
WBC: 6.1 10*3/uL (ref 4.0–10.5)

## 2017-10-29 LAB — COMPREHENSIVE METABOLIC PANEL
ALT: 88 U/L — AB (ref 17–63)
ANION GAP: 8 (ref 5–15)
AST: 86 U/L — ABNORMAL HIGH (ref 15–41)
Albumin: 3.4 g/dL — ABNORMAL LOW (ref 3.5–5.0)
Alkaline Phosphatase: 74 U/L (ref 38–126)
BUN: 12 mg/dL (ref 6–20)
CHLORIDE: 97 mmol/L — AB (ref 101–111)
CO2: 21 mmol/L — ABNORMAL LOW (ref 22–32)
CREATININE: 0.93 mg/dL (ref 0.61–1.24)
Calcium: 8.2 mg/dL — ABNORMAL LOW (ref 8.9–10.3)
Glucose, Bld: 138 mg/dL — ABNORMAL HIGH (ref 65–99)
POTASSIUM: 3.4 mmol/L — AB (ref 3.5–5.1)
Sodium: 126 mmol/L — ABNORMAL LOW (ref 135–145)
Total Bilirubin: 0.7 mg/dL (ref 0.3–1.2)
Total Protein: 6.7 g/dL (ref 6.5–8.1)

## 2017-10-29 LAB — POC INFLUENZA A&B (BINAX/QUICKVUE)
Influenza A, POC: NEGATIVE
Influenza B, POC: NEGATIVE

## 2017-10-29 MED ORDER — OSELTAMIVIR PHOSPHATE 75 MG PO CAPS: 75.0000 mg | ORAL_CAPSULE | Freq: Two times a day (BID) | ORAL | 0 refills | Status: AC

## 2017-10-29 MED ORDER — MUCINEX DM MAXIMUM STRENGTH 60-1200 MG PO TB12: 1.0000 | ORAL_TABLET | Freq: Two times a day (BID) | ORAL | 1 refills | Status: AC

## 2017-10-29 MED ORDER — IPRATROPIUM BROMIDE 0.03 % NA SOLN: 2.0000 | Freq: Two times a day (BID) | NASAL | 0 refills | Status: AC

## 2017-10-29 MED ORDER — ACETAMINOPHEN 500 MG PO TABS
500.0000 mg | ORAL_TABLET | Freq: Once | ORAL | Status: AC
  Administered 2017-10-29: 500 mg via ORAL

## 2017-10-29 NOTE — Patient Instructions (Addendum)
Take Tylenol as needed for your fever.  Come back if you aren't better in 7-10 days.   Stay well hydrated. Drink at least 64 oz water daily. Get lost of rest.  Be sure you are eating at least 2 meals/day. Eat lots of soup.   Advil or ibuprofen for pain. Do not take Aspirin.  Throat lozenges (if you are not at risk for choking) or sprays may be used to soothe your throat. Drink enough water and fluids to keep your urine clear or pale yellow. For sore throat: ? Gargle with 8 oz of salt water ( tsp of salt per 1 qt of water) as often as every 1-2 hours to soothe your throat.  Gargle liquid benadryl.  Use Elderberry syrup.   For sore throat try using a honey-based tea. Use 3 teaspoons of honey with juice squeezed from half lemon. Place shaved pieces of ginger into 1/2-1 cup of water and warm over stove top. Then mix the ingredients and repeat every 4 hours as needed.  Cough Syrup Recipe: Sweet Lemon & Honey Thyme  Ingredients a handful of fresh thyme sprigs   1 pint of water (2 cups)  1/2 cup honey (raw is best, but regular will do)  1/2 lemon chopped Instructions 1. Place the lemon in the pint jar and cover with the honey. The honey will macerate the lemons and draw out liquids which taste so delicious! 2. Meanwhile, toss the thyme leaves into a saucepan and cover them with the water. 3. Bring the water to a gentle simmer and reduce it to half, about a cup of tea. 4. When the tea is reduced and cooled a bit, strain the sprigs & leaves, add it into the pint jar and stir it well. 5. Give it a shake and use a spoonful as needed. 6. Store your homemade cough syrup in the refrigerator for about a month.  ?Mucolytics - Mucolytics such as guaifenesin serve to thin secretions and may promote ease of mucus drainage and clearance.  Use Neti Pot (or nasal saline irrigation) to help clear your nose from mucus.   SALINE NASAL IRRIGATION  The benefits  1. Saline (saltwater) washes the mucus and  irritants from your nose.  2. The sinus passages are moisturized.  3. Studies have also shown that a nasal irrigation improves cell function (the cells that move the mucus work better).  The recipe  Use a one-quart glass jar that is thoroughly cleansed.  You may use a large medical syringe (30 cc), water pick with an irrigation tip (preferred method), squeeze bottle, or Neti pot. Do not use a baby bulb syringe. The syringe or pick should be sterilized frequently or replaced every two to three weeks to avoid contamination and infection.  Fill with water that has been distilled, previously boiled, or otherwise sterilized. Plain tap water is not recommended, because it is not necessarily sterile.  Add 1 to 1 heaping teaspoons of pickling/canning salt. Do not use table salt, because it contains a large number of additives.  Add 1 teaspoon of baking soda (pure bicarbonate).  Mix ingredients together, and store at room temperature. Discard after one week.  You may also make up a solution from premixed packets that are commercially prepared specifically for nasal irrigation.  The instructions  Irrigate your nose with saline one to two times per day.   If you have been told to use nasal medication, you should always use your saline solution first. The nasal medication is   much more effective when sprayed onto clean nasal membranes, and the spray will reach deeper into the nose.   Pour the amount of fluid you plan to use into a clean bowl. Do not put your used syringe back into the storage container, because it contaminates your solution.   You may warm the solution slightly in the microwave, but be sure that the solution is not hot.   Bend over the sink (some people do this in the shower), and squirt the solution into each side of your nose, aiming the stream toward the back of your head, not the top of your head. The solution should flow into one nostril and out of the other, but it will not harm you if  you swallow a little.   Some people experience a little burning sensation the first few times that they use buffered saline solution, but this usually goes away after they adapt to it.    Thank you for coming in today. I hope you feel we met your needs.  Feel free to call PCP if you have any questions or further requests.  Please consider signing up for MyChart if you do not already have it, as this is a great way to communicate with me.  Best,  Whitney McVey, PA-C   IF you received an x-ray today, you will receive an invoice from [facility]. Please contact [facility] at [phone] with questions or concerns regarding your invoice.   IF you received labwork today, you will receive an invoice from [facility]. Please contact [facility] at [phone] with questions or concerns regarding your invoice.   Our billing staff will not be able to assist you with questions regarding bills from these companies.  You will be contacted with the lab results as soon as they are available. The fastest way to get your results is to activate your My Chart account. Instructions are located on the last page of this paperwork. If you have not heard from us regarding the results in 2 weeks, please contact this office.     

## 2017-10-29 NOTE — ED Triage Notes (Signed)
Patient reports fever with chills , headache , occasional dry cough and nasal congestion onset this week , seen at [facility] diagnosed with viral illness discharged home with prescriptions with no improvement , Flu test negative at urgent care .

## 2017-10-29 NOTE — Progress Notes (Signed)
Vincent Larson  MRN: [id number] DOB: 1971/10/28  PCP: Massie MaroonHollis, [name], FNP  Subjective:  Pt is a 46 year old male who presents to clinic for fever x 2 days. Headache, chills, fever, nausea, runny nose, sneezing.  He is not eating much - soup for dinner last night. Not drinking much water. Hot tea this morning and last night. Not sleeping well due to headache.  Tylenol, DayQuil yesterday. Sick contact at work and home.    Review of Systems  Constitutional: Positive for chills, fatigue and fever.  HENT: Positive for congestion and rhinorrhea.   Respiratory: Positive for cough.   Neurological: Positive for headaches.    Patient Active Problem List   Diagnosis Date Noted  . Hyperlipidemia 02/07/2016  . Prediabetes 02/07/2016  . Annual physical exam 02/06/2016    Current Outpatient Prescriptions on File Prior to Visit  Medication Sig Dispense Refill  . atorvastatin (LIPITOR) 20 MG tablet Take 1 tablet (20 mg total) by mouth daily. 90 tablet 1  . terbinafine (LAMISIL) 1 % cream Apply 1 application topically 2 (two) times daily.     No current facility-administered medications on file prior to visit.     No Known Allergies   Objective:  BP 98/60   Pulse 67   Temp (!) 102 F (38.9 C) (Oral)   Resp 16   Ht 5' 3.5" (1.613 m)   Wt 148 lb 9.6 oz (67.4 kg)   SpO2 98%   BMI 25.91 kg/m  Orthostatic VS for the past 24 hrs:  BP- Lying Pulse- Lying BP- Sitting Pulse- Sitting BP- Standing at 0 minutes Pulse- Standing at 0 minutes  10/29/17 1019 112/68 84 104/69 99 101/70 111    Physical Exam  Constitutional: He is oriented to person, place, and time and well-developed, well-nourished, and in no distress. No distress.  HENT:  Right Ear: Tympanic membrane normal.  Left Ear: Tympanic membrane normal.  Nose: Mucosal edema and rhinorrhea present. Right sinus exhibits no maxillary sinus tenderness and no frontal sinus tenderness. Left sinus exhibits no maxillary sinus  tenderness and no frontal sinus tenderness.  Mouth/Throat: Uvula is midline. Mucous membranes are dry. Posterior oropharyngeal edema present. No oropharyngeal exudate or posterior oropharyngeal erythema.  Cardiovascular: Normal rate, regular rhythm and normal heart sounds.   Lymphadenopathy:    He has cervical adenopathy.       Right cervical: Deep cervical adenopathy present.       Left cervical: Deep cervical adenopathy present.  Neurological: He is alert and oriented to person, place, and time. GCS score is 15.  Skin: Skin is warm and dry.  Psychiatric: Mood, memory, affect and judgment normal.  Vitals reviewed.   Results for orders placed or performed in visit on 10/29/17  POC Influenza A&B(BINAX/QUICKVUE)  Result Value Ref Range   Influenza A, POC Negative Negative   Influenza B, POC Negative Negative   Assessment and Plan :  1. Fever, unspecified fever cause - POC Influenza A&B(BINAX/QUICKVUE) - acetaminophen (TYLENOL) tablet 500 mg; Take 1 tablet (500 mg total) by mouth once. - Orthostatic vital signs - Suspect negative rapid flu due to pt presentation. Plan to treat. His blood pressure is low, however he is able to tolerate PO fluids - encouraged pt to push fluids. He is not orthostatic. Tylenol for fever.  RTC in 3-5 days if no improvement.  2. Influenza - oseltamivir (TAMIFLU) 75 MG capsule; Take 1 capsule (75 mg total) by mouth 2 (two) times daily.  Dispense: 10  capsule; Refill: 0  3. Nasal congestion 4. Cough - ipratropium (ATROVENT) 0.03 % nasal spray; Place 2 sprays into both nostrils 2 (two) times daily.  Dispense: 30 mL; Refill: 0 - Dextromethorphan-Guaifenesin (MUCINEX DM MAXIMUM STRENGTH) 60-1200 MG TB12; Take 1 tablet by mouth every 12 (twelve) hours.  Dispense: 14 each; Refill: 1   Whitney Taylormarie Register, PA-C  [facility] 10/29/2017 9:42 AM

## 2017-10-30 ENCOUNTER — Emergency Department (HOSPITAL_COMMUNITY): Payer: Self-pay

## 2017-10-30 ENCOUNTER — Emergency Department (HOSPITAL_COMMUNITY)
Admission: EM | Admit: 2017-10-30 | Discharge: 2017-10-30 | Disposition: A | Discharge status: Home / Self Care | Payer: Self-pay | Attending: Emergency Medicine | Admitting: Emergency Medicine

## 2017-10-30 DIAGNOSIS — J069 Acute upper respiratory infection, unspecified: Secondary | ICD-10-CM

## 2017-10-30 DIAGNOSIS — R509 Fever, unspecified: Secondary | ICD-10-CM

## 2017-10-30 MED ORDER — KETOROLAC TROMETHAMINE 30 MG/ML IJ SOLN
30.0000 mg | Freq: Once | INTRAMUSCULAR | Status: AC
  Administered 2017-10-30: 30 mg via INTRAVENOUS
  Filled 2017-10-30: qty 1

## 2017-10-30 MED ORDER — DOXYCYCLINE HYCLATE 100 MG PO CAPS: 100.0000 mg | ORAL_CAPSULE | Freq: Two times a day (BID) | ORAL | 0 refills | Status: AC

## 2017-10-30 MED ORDER — SODIUM CHLORIDE 0.9 % IV BOLUS (SEPSIS)
1000.0000 mL | Freq: Once | INTRAVENOUS | Status: AC
  Administered 2017-10-30: 1000 mL via INTRAVENOUS

## 2017-10-30 NOTE — ED Notes (Signed)
Pt reports fever, chills, and congestion for the last 2 days. Pt has been taking OTC meds without relief.

## 2017-10-30 NOTE — ED Provider Notes (Signed)
[facility] EMERGENCY DEPARTMENT Provider Note   CSN: [id number] Arrival date & time: 10/29/17  2149     History   Chief Complaint No chief complaint on file.   HPI Vincent Larson is a 46 y.o. male.  The history is provided by the patient. A language interpreter was used [phone]).  Fever   This is a new problem. The current episode started 2 days ago. The problem occurs daily. The problem has been gradually worsening. The maximum temperature noted was 102 to 102.9 F. Associated symptoms include headaches, sore throat, muscle aches and cough. Pertinent negatives include no chest pain, no diarrhea and no vomiting.  Patient with h/o anxiety reports fever for past 2 days He reports HA/sore throat/cough No rash No travel No tick bites No vomiting/diarrhea Seen at urgent care yesterday, given meds but no improvement  Past Medical History:  Diagnosis Date  . Anxiety     Patient Active Problem List   Diagnosis Date Noted  . Hyperlipidemia 02/07/2016  . Prediabetes 02/07/2016  . Annual physical exam 02/06/2016    History reviewed. No pertinent surgical history.     Home Medications    Prior to Admission medications   Medication Sig Start Date End Date Taking? Authorizing Provider  atorvastatin (LIPITOR) 20 MG tablet Take 1 tablet (20 mg total) by mouth daily. 02/07/16   Massie Maroon, FNP  Dextromethorphan-Guaifenesin (MUCINEX DM MAXIMUM STRENGTH) 60-1200 MG TB12 Take 1 tablet by mouth every 12 (twelve) hours. 10/29/17   McVey, Madelaine Bhat, PA-C  ipratropium (ATROVENT) 0.03 % nasal spray Place 2 sprays into both nostrils 2 (two) times daily. 10/29/17   McVey, Madelaine Bhat, PA-C  oseltamivir (TAMIFLU) 75 MG capsule Take 1 capsule (75 mg total) by mouth 2 (two) times daily. 10/29/17   McVey, Madelaine Bhat, PA-C  terbinafine (LAMISIL) 1 % cream Apply 1 application topically 2 (two) times daily.    [provider]    Family  History No family history on file.  Social History Social History  Substance Use Topics  . Smoking status: Never Smoker  . Smokeless tobacco: Never Used  . Alcohol use No     Allergies   Patient has no known allergies.   Review of Systems Review of Systems  Constitutional: Positive for fever.  HENT: Positive for sore throat.   Respiratory: Positive for cough.   Cardiovascular: Negative for chest pain.  Gastrointestinal: Negative for diarrhea and vomiting.  Skin: Negative for rash.  Neurological: Positive for headaches.  All other systems reviewed and are negative.    Physical Exam Updated Vital Signs BP 127/80   Pulse 73   Temp 99.5 F (37.5 C) (Oral)   Resp 13   Ht 1.651 m (5\' 5" )   Wt 67.1 kg (148 lb)   SpO2 99%   BMI 24.63 kg/m   Physical Exam CONSTITUTIONAL: Well developed/well nourished HEAD: Normocephalic/atraumatic EYES: EOMI/PERRL ENMT: Mucous membranes moist, uvula midline, no erythema/exudates NECK: supple no meningeal signs SPINE/BACK:entire spine nontender CV: S1/S2 noted, no murmurs/rubs/gallops noted LUNGS: Lungs are clear to auscultation bilaterally, no apparent distress ABDOMEN: soft, nontender, no rebound or guarding, bowel sounds noted throughout abdomen GU:no cva tenderness NEURO: Pt is awake/alert/appropriate, moves all extremitiesx4.  No facial droop.   EXTREMITIES: pulses normal/equal, full ROM SKIN: warm, color normal, no rash/petechiae noted PSYCH: no abnormalities of mood noted, alert and oriented to situation   ED Treatments / Results  Labs (all labs ordered are listed, but only abnormal results  are displayed) Labs Reviewed  URINALYSIS, ROUTINE W REFLEX MICROSCOPIC - Abnormal; Notable for the following:       Result Value   Hgb urine dipstick SMALL (*)    Protein, ur 30 (*)    Bacteria, UA RARE (*)    Squamous Epithelial / LPF 0-5 (*)    All other components within normal limits  CBC WITH DIFFERENTIAL/PLATELET - Abnormal;  Notable for the following:    HCT 37.1 (*)    All other components within normal limits  COMPREHENSIVE METABOLIC PANEL - Abnormal; Notable for the following:    Sodium 126 (*)    Potassium 3.4 (*)    Chloride 97 (*)    CO2 21 (*)    Glucose, Bld 138 (*)    Calcium 8.2 (*)    Albumin 3.4 (*)    AST 86 (*)    ALT 88 (*)    All other components within normal limits  ROCKY MTN SPOTTED FVR ABS PNL(IGG+IGM)    EKG  EKG Interpretation None       Radiology Dg Chest 2 View  Result Date: 10/30/2017 CLINICAL DATA:  Cough.  Fever for 3 days. EXAM: CHEST  2 VIEW COMPARISON:  None. FINDINGS: Low lung volumes on the AP view. The cardiomediastinal contours are normal. The lungs are clear. Pulmonary vasculature is normal. No consolidation, pleural effusion, or pneumothorax. No acute osseous abnormalities are seen. IMPRESSION: Low lung volumes without acute abnormality. Electronically Signed   By: Rubye OaksMelanie  Ehinger M.D.   On: 10/30/2017 05:57    Procedures Procedures  Medications Ordered in ED Medications  sodium chloride 0.9 % bolus 1,000 mL (0 mLs Intravenous Stopped 10/30/17 0706)  ketorolac (TORADOL) 30 MG/ML injection 30 mg (30 mg Intravenous Given 10/30/17 0529)     Initial Impression / Assessment and Plan / ED Course  I have reviewed the triage vital signs and the nursing notes.  Pertinent labs & imaging results that were available during my care of the patient were reviewed by me and considered in my medical decision making (see chart for details).    5:11 AM Pt seen at urgent care for fever, now worsening Due to cough, will get CXR  7:08 AM Pt stable Awake/alert No meningeal signs He is not septic appearing CXR negative Will discharge  Strong suspicion for viral illness, however he did have lab abnormalities (hyponatrema, elevated LFTs) that could suggest RMSF RMSF labs sent Will prescribe doxycycline, and advised if no improvement in 48 hrs he should start doxy  We  discussed strict ER return precautions   Final Clinical Impressions(s) / ED Diagnoses   Final diagnoses:  Acute febrile illness  Viral URI    New Prescriptions New Prescriptions   DOXYCYCLINE (VIBRAMYCIN) 100 MG CAPSULE    Take 1 capsule (100 mg total) by mouth 2 (two) times daily.     Zadie RhineWickline, Vivi Piccirilli, MD 10/30/17 [phone]

## 2017-10-31 LAB — ROCKY MTN SPOTTED FVR ABS PNL(IGG+IGM)
RMSF IGM: 0.76 {index} (ref 0.00–0.89)
RMSF IgG: NEGATIVE

## 2019-04-02 ENCOUNTER — Ambulatory Visit: Payer: Self-pay | Admitting: Family Medicine

## 2019-06-04 ENCOUNTER — Other Ambulatory Visit: Payer: Self-pay

## 2019-06-04 ENCOUNTER — Ambulatory Visit: Payer: Self-pay | Attending: Family Medicine | Admitting: Family Medicine

## 2019-06-04 DIAGNOSIS — Z5329 Procedure and treatment not carried out because of patient's decision for other reasons: Secondary | ICD-10-CM

## 2019-10-31 IMAGING — DX DG CHEST 2V
2 series · 2 of 2 positions shown · non-contrast
Comparison: None.

CLINICAL DATA: Cough.  Fever for 3 days.

EXAM:
CHEST  2 VIEW

[chest pa]
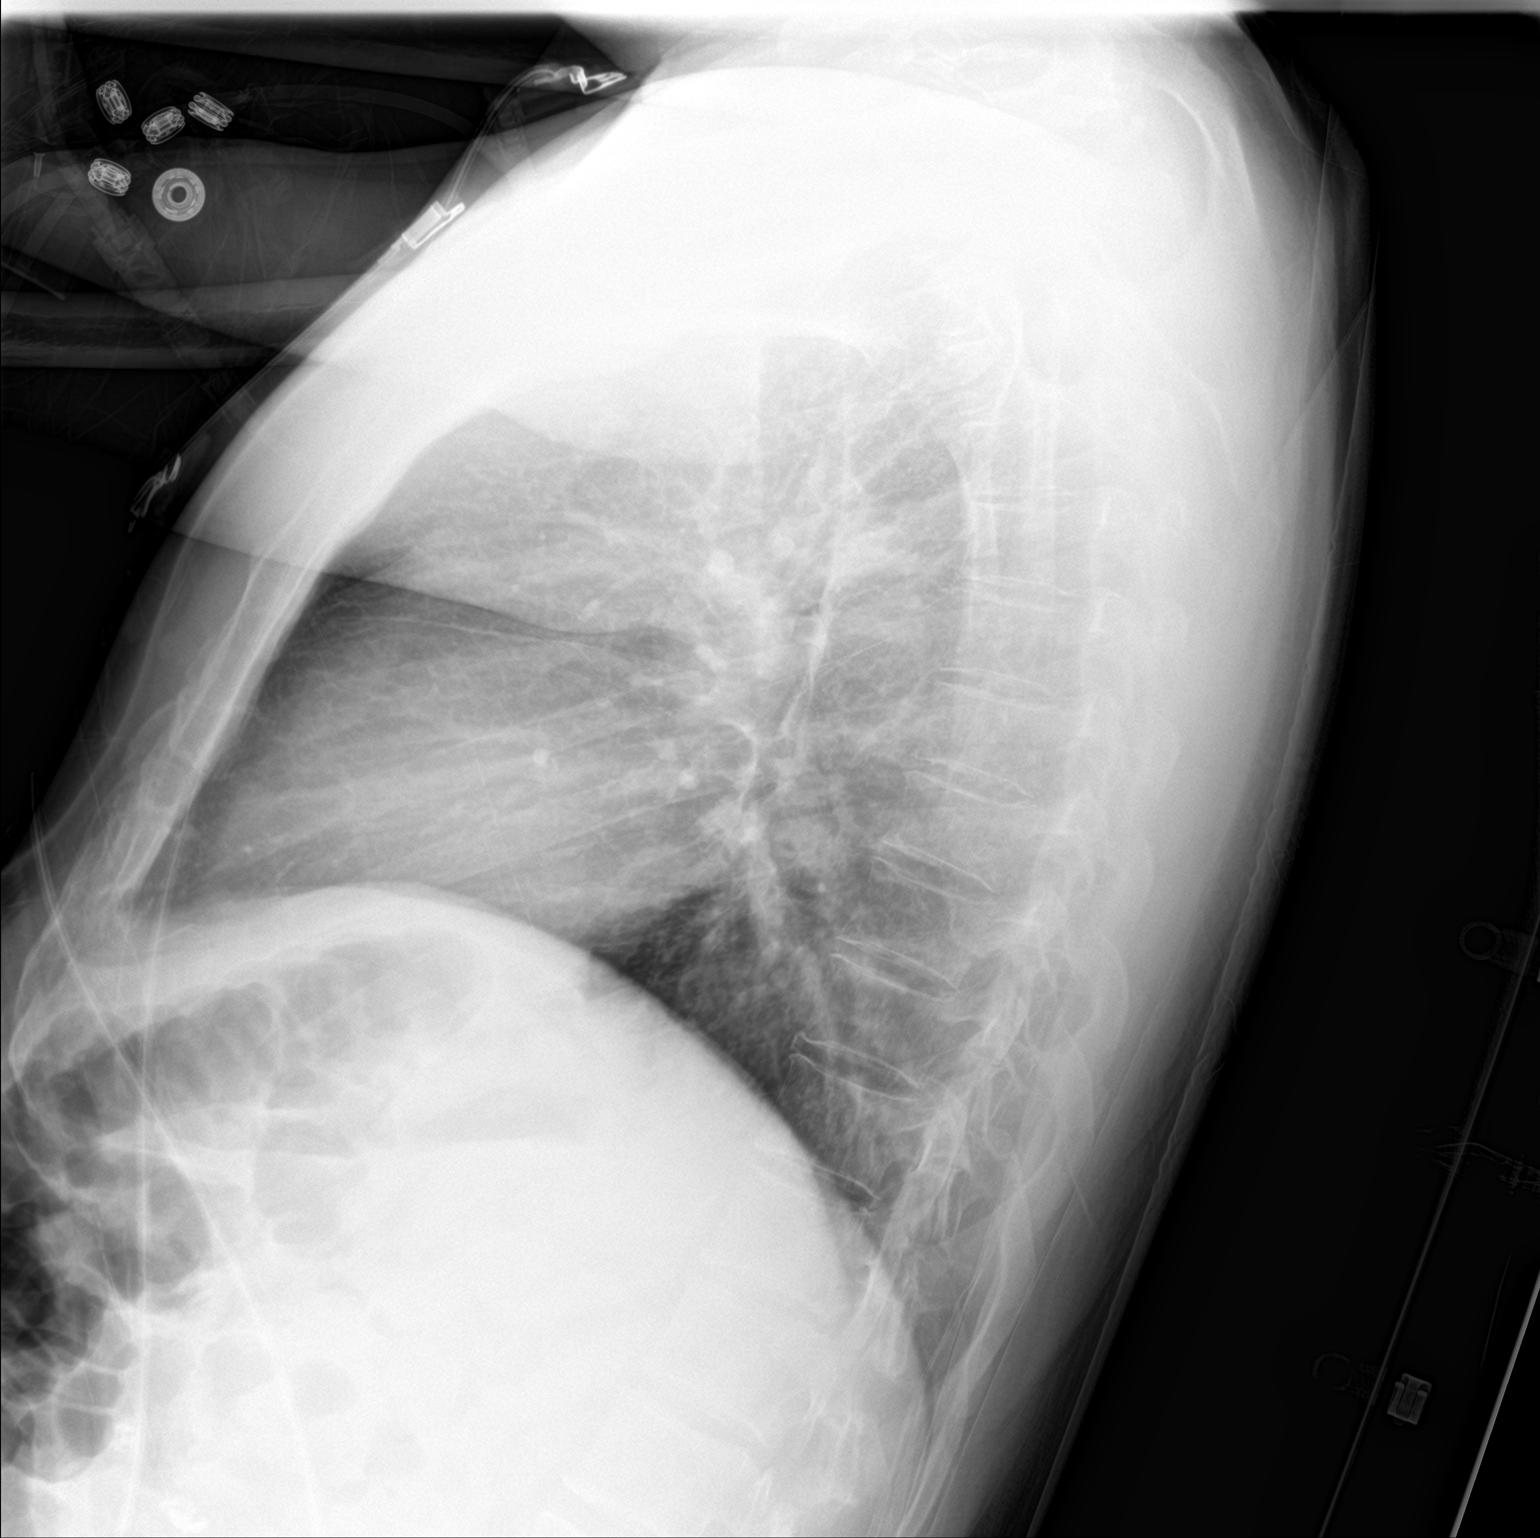

[chest ap]
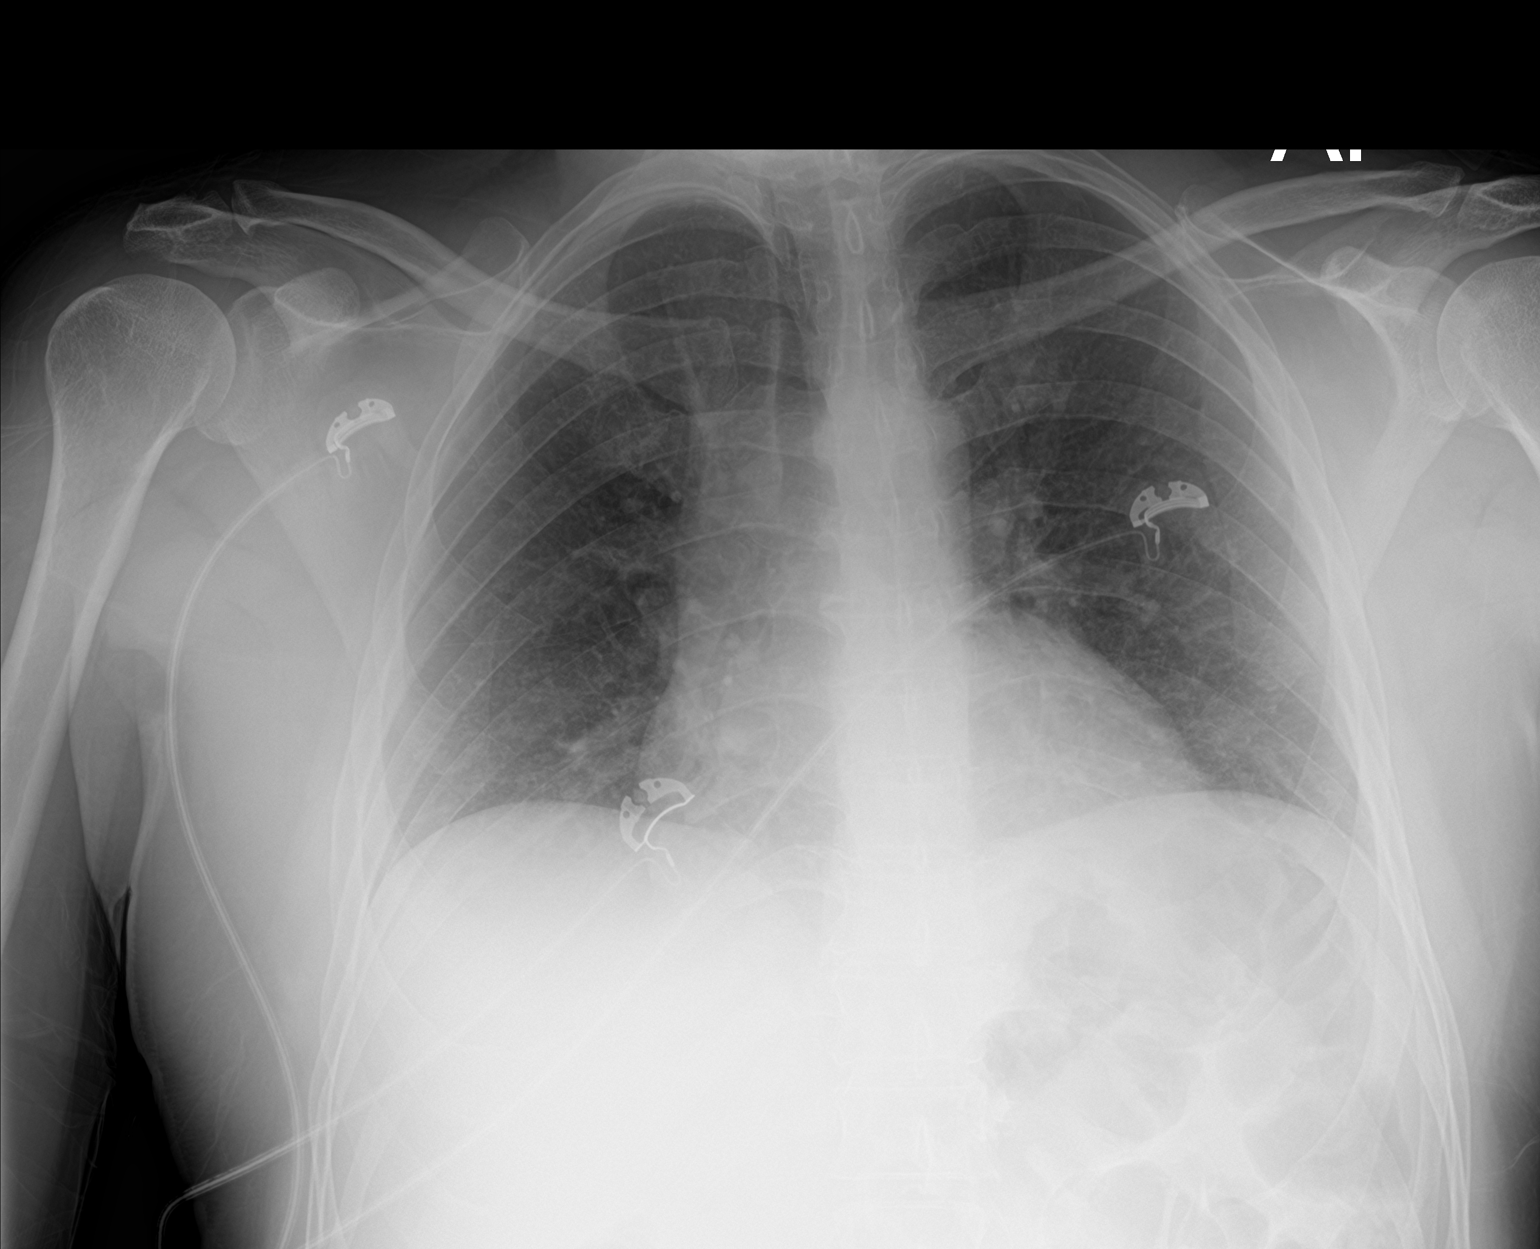

[2 of 2 positions shown; findings below may reference images not displayed]

FINDINGS: Low lung volumes on the AP view. The cardiomediastinal contours are
normal. The lungs are clear. Pulmonary vasculature is normal. No
consolidation, pleural effusion, or pneumothorax. No acute osseous
abnormalities are seen.
IMPRESSION: Low lung volumes without acute abnormality.
